# Patient Record
Sex: Male | Born: 1985 | Race: Black or African American | Hispanic: No | Marital: Single | Smoking: Current some day smoker
Health system: Southern US, Community
[De-identification: ages and names within clinical notes are randomized; demographics above are authoritative.]

## PROBLEM LIST (undated history)

## (undated) HISTORY — PX: SHOULDER SURGERY: SHX246

---
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
## 2005-03-08 ENCOUNTER — Emergency Department (HOSPITAL_COMMUNITY): Admission: EM | Admit: 2005-03-08 | Discharge: 2005-03-09 | Payer: Self-pay | Admitting: Emergency Medicine

## 2005-03-12 ENCOUNTER — Emergency Department (HOSPITAL_COMMUNITY): Admission: EM | Admit: 2005-03-12 | Discharge: 2005-03-12 | Payer: Self-pay | Admitting: Family Medicine

## 2006-11-25 ENCOUNTER — Emergency Department (HOSPITAL_COMMUNITY): Admission: EM | Admit: 2006-11-25 | Discharge: 2006-11-25 | Payer: Self-pay | Admitting: Emergency Medicine

## 2007-03-13 ENCOUNTER — Emergency Department (HOSPITAL_COMMUNITY): Admission: EM | Admit: 2007-03-13 | Discharge: 2007-03-13 | Payer: Self-pay | Admitting: Emergency Medicine

## 2007-04-12 ENCOUNTER — Ambulatory Visit (HOSPITAL_COMMUNITY): Admission: RE | Admit: 2007-04-12 | Discharge: 2007-04-12 | Payer: Self-pay | Admitting: Chiropractic Medicine

## 2008-02-26 ENCOUNTER — Emergency Department (HOSPITAL_COMMUNITY): Admission: EM | Admit: 2008-02-26 | Discharge: 2008-02-26 | Payer: Self-pay | Admitting: Emergency Medicine

## 2011-01-26 LAB — POCT I-STAT, CHEM 8
BUN: 12
Calcium, Ion: 1.17
Chloride: 102
Creatinine, Ser: 1.3
Glucose, Bld: 75
HCT: 46
Hemoglobin: 15.6
Potassium: 3.8
Sodium: 138
TCO2: 27

## 2011-01-26 LAB — URINALYSIS, ROUTINE W REFLEX MICROSCOPIC
Bilirubin Urine: NEGATIVE
Glucose, UA: NEGATIVE
Hgb urine dipstick: NEGATIVE
Ketones, ur: NEGATIVE
Nitrite: NEGATIVE
Protein, ur: NEGATIVE
Specific Gravity, Urine: 1.018
Urobilinogen, UA: 0.2
pH: 6

## 2012-06-13 ENCOUNTER — Encounter (HOSPITAL_COMMUNITY): Payer: Self-pay | Admitting: *Deleted

## 2012-06-13 ENCOUNTER — Emergency Department (INDEPENDENT_AMBULATORY_CARE_PROVIDER_SITE_OTHER)
Admission: EM | Admit: 2012-06-13 | Discharge: 2012-06-13 | Disposition: A | Discharge status: Home / Self Care | Payer: Self-pay | Source: Home / Self Care | Attending: Emergency Medicine | Admitting: Emergency Medicine

## 2012-06-13 DIAGNOSIS — S46019A Strain of muscle(s) and tendon(s) of the rotator cuff of unspecified shoulder, initial encounter: Secondary | ICD-10-CM

## 2012-06-13 DIAGNOSIS — S43429A Sprain of unspecified rotator cuff capsule, initial encounter: Secondary | ICD-10-CM

## 2012-06-13 DIAGNOSIS — S20219A Contusion of unspecified front wall of thorax, initial encounter: Secondary | ICD-10-CM

## 2012-06-13 MED ORDER — TRAMADOL HCL 50 MG PO TABS: 100.0000 mg | ORAL_TABLET | Freq: Three times a day (TID) | ORAL | Status: DC | PRN

## 2012-06-13 MED ORDER — METHOCARBAMOL 500 MG PO TABS: 500.0000 mg | ORAL_TABLET | Freq: Three times a day (TID) | ORAL | Status: DC

## 2012-06-13 MED ORDER — MELOXICAM 15 MG PO TABS: 15.0000 mg | ORAL_TABLET | Freq: Every day | ORAL | Status: DC
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
## 2013-07-14 ENCOUNTER — Emergency Department (INDEPENDENT_AMBULATORY_CARE_PROVIDER_SITE_OTHER)
Admission: EM | Admit: 2013-07-14 | Discharge: 2013-07-14 | Disposition: A | Discharge status: Home / Self Care | Payer: BC Managed Care – PPO | Source: Home / Self Care | Attending: Family Medicine | Admitting: Family Medicine

## 2013-07-14 ENCOUNTER — Encounter (HOSPITAL_COMMUNITY): Payer: Self-pay | Admitting: Emergency Medicine

## 2013-07-14 DIAGNOSIS — M545 Low back pain, unspecified: Secondary | ICD-10-CM

## 2013-07-14 MED ORDER — CYCLOBENZAPRINE HCL 10 MG PO TABS
10.0000 mg | ORAL_TABLET | Freq: Every evening | ORAL | Status: AC | PRN
Start: 2013-07-14 — End: ?

## 2013-07-14 MED ORDER — PREDNISONE 10 MG PO KIT: PACK | ORAL | Status: AC
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
## 2013-07-14 NOTE — Discharge Instructions (Signed)
Thank you for coming in today. Take prednisone as directed for 12 days. Use Flexeril at bedtime as needed. [facility] if not better Come back or go to the emergency room if you notice new weakness new numbness problems walking or bowel or bladder problems.  Back Exercises These exercises may help you when beginning to rehabilitate your injury. Your symptoms may resolve with or without further involvement from your physician, physical therapist or athletic trainer. While completing these exercises, remember:   Restoring tissue flexibility helps normal motion to return to the joints. This allows healthier, less painful movement and activity.  An effective stretch should be held for at least 30 seconds.  A stretch should never be painful. You should only feel a gentle lengthening or release in the stretched tissue. STRETCH  Extension, Prone on Elbows   Lie on your stomach on the floor, a bed will be too soft. Place your palms about shoulder width apart and at the height of your head.  Place your elbows under your shoulders. If this is too painful, stack pillows under your chest.  Allow your body to relax so that your hips drop lower and make contact more completely with the floor.  Hold this position for __________ seconds.  Slowly return to lying flat on the floor. Repeat __________ times. Complete this exercise __________ times per day.  RANGE OF MOTION  Extension, Prone Press Ups   Lie on your stomach on the floor, a bed will be too soft. Place your palms about shoulder width apart and at the height of your head.  Keeping your back as relaxed as possible, slowly straighten your elbows while keeping your hips on the floor. You may adjust the placement of your hands to maximize your comfort. As you gain motion, your hands will come more underneath your shoulders.  Hold this position __________ seconds.  Slowly return to lying flat on the  floor. Repeat __________ times. Complete this exercise __________ times per day.  RANGE OF MOTION- Quadruped, Neutral Spine   Assume a hands and knees position on a firm surface. Keep your hands under your shoulders and your knees under your hips. You may place padding under your knees for comfort.  Drop your head and point your tail bone toward the ground below you. This will round out your low back like an angry cat. Hold this position for __________ seconds.  Slowly lift your head and release your tail bone so that your back sags into a large arch, like an old horse.  Hold this position for __________ seconds.  Repeat this until you feel limber in your low back.  Now, find your "sweet spot." This will be the most comfortable position somewhere between the two previous positions. This is your neutral spine. Once you have found this position, tense your stomach muscles to support your low back.  Hold this position for __________ seconds. Repeat __________ times. Complete this exercise __________ times per day.  STRETCH  Flexion, Single Knee to Chest   Lie on a firm bed or floor with both legs extended in front of you.  Keeping one leg in contact with the floor, bring your opposite knee to your chest. Hold your leg in place by either grabbing behind your thigh or at your knee.  Pull until you feel a gentle stretch in your low back. Hold __________ seconds.  Slowly release your grasp and repeat the exercise with the opposite side. Repeat __________  times. Complete this exercise __________ times per day.  STRETCH - Hamstrings, Standing  Stand or sit and extend your right / left leg, placing your foot on a chair or foot stool  Keeping a slight arch in your low back and your hips straight forward.  Lead with your chest and lean forward at the waist until you feel a gentle stretch in the back of your right / left knee or thigh. (When done correctly, this exercise requires leaning only a  small distance.)  Hold this position for __________ seconds. Repeat __________ times. Complete this stretch __________ times per day. STRENGTHENING  Deep Abdominals, Pelvic Tilt   Lie on a firm bed or floor. Keeping your legs in front of you, bend your knees so they are both pointed toward the ceiling and your feet are flat on the floor.  Tense your lower abdominal muscles to press your low back into the floor. This motion will rotate your pelvis so that your tail bone is scooping upwards rather than pointing at your feet or into the floor.  With a gentle tension and even breathing, hold this position for __________ seconds. Repeat __________ times. Complete this exercise __________ times per day.  STRENGTHENING  Abdominals, Crunches   Lie on a firm bed or floor. Keeping your legs in front of you, bend your knees so they are both pointed toward the ceiling and your feet are flat on the floor. Cross your arms over your chest.  Slightly tip your chin down without bending your neck.  Tense your abdominals and slowly lift your trunk high enough to just clear your shoulder blades. Lifting higher can put excessive stress on the low back and does not further strengthen your abdominal muscles.  Control your return to the starting position. Repeat __________ times. Complete this exercise __________ times per day.  STRENGTHENING  Quadruped, Opposite UE/LE Lift   Assume a hands and knees position on a firm surface. Keep your hands under your shoulders and your knees under your hips. You may place padding under your knees for comfort.  Find your neutral spine and gently tense your abdominal muscles so that you can maintain this position. Your shoulders and hips should form a rectangle that is parallel with the floor and is not twisted.  Keeping your trunk steady, lift your right hand no higher than your shoulder and then your left leg no higher than your hip. Make sure you are not holding your  breath. Hold this position __________ seconds.  Continuing to keep your abdominal muscles tense and your back steady, slowly return to your starting position. Repeat with the opposite arm and leg. Repeat __________ times. Complete this exercise __________ times per day. Document Released: 04/30/2005 Document Revised: 07/05/2011 Document Reviewed: 07/25/2008 [facility] Patient Information 2014 [place], [place].
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
## 2013-10-08 ENCOUNTER — Emergency Department (INDEPENDENT_AMBULATORY_CARE_PROVIDER_SITE_OTHER)
Admission: EM | Admit: 2013-10-08 | Discharge: 2013-10-08 | Disposition: A | Discharge status: Home / Self Care | Payer: BC Managed Care – PPO | Source: Home / Self Care

## 2013-10-08 ENCOUNTER — Encounter (HOSPITAL_COMMUNITY): Payer: Self-pay | Admitting: Emergency Medicine

## 2013-10-08 ENCOUNTER — Emergency Department (INDEPENDENT_AMBULATORY_CARE_PROVIDER_SITE_OTHER): Payer: BC Managed Care – PPO

## 2013-10-08 DIAGNOSIS — S93401A Sprain of unspecified ligament of right ankle, initial encounter: Secondary | ICD-10-CM

## 2013-10-08 DIAGNOSIS — X500XXA Overexertion from strenuous movement or load, initial encounter: Secondary | ICD-10-CM

## 2013-10-08 DIAGNOSIS — Y939 Activity, unspecified: Secondary | ICD-10-CM

## 2013-10-08 DIAGNOSIS — S93409A Sprain of unspecified ligament of unspecified ankle, initial encounter: Secondary | ICD-10-CM

## 2013-10-08 MED ORDER — KETOROLAC TROMETHAMINE 10 MG PO TABS: ORAL_TABLET | ORAL | Status: AC

## 2013-10-08 NOTE — ED Provider Notes (Signed)
CSN: [id number]     Arrival date & time 10/08/13  1148 History   First MD Initiated Contact with Patient 10/08/13 1336     Chief Complaint  Patient presents with  . Ankle Pain   (Consider location/radiation/quality/duration/timing/severity/associated sxs/prior Treatment) HPI Comments: Keith Knox, this 28 year old male stepped in a hole with his right foot and twisted his ankle. He describes an inversion type injury. He has pain and swelling primarily to the lateral aspect of the ankle and proximal foot. There is pain with weightbearing.   History reviewed. No pertinent past medical history. Past Surgical History  Procedure Laterality Date  . Shoulder surgery  2009 and 2006    bilateral ligament repair   History reviewed. No pertinent family history. History  Substance Use Topics  . Smoking status: Current Some Day Smoker    Types: Cigarettes  . Smokeless tobacco: Not on file  . Alcohol Use: Yes     Comment: occasional    Review of Systems  Constitutional: Positive for activity change.  Respiratory: Negative.   Gastrointestinal: Negative.   Genitourinary: Negative.   Musculoskeletal: Positive for gait problem and joint swelling. Negative for back pain.       As per HPI  Skin: Negative.   Neurological: Negative for dizziness, weakness, numbness and headaches.    Allergies  Review of patient's allergies indicates no known allergies.  Home Medications   Prior to Admission medications   Medication Sig Start Date End Date Taking? Authorizing Provider  cyclobenzaprine (FLEXERIL) 10 MG tablet Take 1 tablet (10 mg total) by mouth at bedtime as needed for muscle spasms. 07/14/13   Gregor Hams, MD  PredniSONE 10 MG KIT 12 day dose pack po 07/14/13   Gregor Hams, MD   BP 119/77  Pulse 54  Temp(Src) 98.2 F (36.8 C) (Oral)  Resp 14  SpO2 100% Physical Exam  Nursing note and vitals reviewed. Constitutional: He is oriented to person, place, and time. He appears well-developed  and well-nourished.  HENT:  Head: Normocephalic and atraumatic.  Eyes: EOM are normal.  Neck: Normal range of motion. Neck supple.  Pulmonary/Chest: Effort normal. No respiratory distress.  Musculoskeletal:  Swelling and tenderness to the lateral aspect of the ankle, primarily surrounding the lateral malleolus. Tenderness extends to the dorsal aspect of the proximal foot. Mild ecchymosis. No deformity. No tenderness or swelling to the medial malleolus. Range of motion is limited particularly with dorsiflexion due to pain. Distal neurovascular motor sensory is intact with the exception of range of motion as above. Pedal pulses 2+.  Neurological: He is alert and oriented to person, place, and time. No cranial nerve deficit.  Skin: Skin is warm and dry.  Psychiatric: He has a normal mood and affect.    ED Course  Procedures (including critical care time) Labs Review Labs Reviewed - No data to display  Imaging Review Dg Ankle Complete Right  10/08/2013   CLINICAL DATA:  Ankle injury with pain can lateral soft tissue swelling.  EXAM: RIGHT ANKLE - COMPLETE 3+ VIEW  COMPARISON:  None.  FINDINGS: There is no evidence for fracture, subluxation or dislocation. Ankle mortise is preserved. No worrisome lytic or sclerotic osseous lesion.  IMPRESSION: No acute bony findings.   Electronically Signed   By: Misty Stanley M.D.   On: 10/08/2013 13:38     MDM   1. Right ankle sprain    RICE ASO Crutches No wt bearing for 2-3 d then as tolerated toradol po for pain  Janne Napoleon, NP 10/08/13 [place], NP 10/08/13 1426

## 2013-10-08 NOTE — Discharge Instructions (Signed)

## 2013-10-08 NOTE — ED Notes (Signed)
C/o right ankle pain States as he was walking in the park; he walked in to a hole that was covered by grass and his ankle rolled States ankle was swollen and the skin was discoloring Did ice and elevate ankle States hard to walk and put shoes on

## 2013-10-10 NOTE — ED Provider Notes (Signed)
Medical screening examination/treatment/procedure(s) were performed by non-physician practitioner and as supervising physician I was immediately available for consultation/collaboration.  Josip Merolla, M.D.  Omega Slager C Waldemar Siegel, MD 10/10/13 1329 

## 2014-02-18 ENCOUNTER — Encounter (HOSPITAL_COMMUNITY): Payer: Self-pay | Admitting: Emergency Medicine

## 2014-02-18 ENCOUNTER — Emergency Department (INDEPENDENT_AMBULATORY_CARE_PROVIDER_SITE_OTHER)
Admission: EM | Admit: 2014-02-18 | Discharge: 2014-02-18 | Disposition: A | Discharge status: Home / Self Care | Payer: BC Managed Care – PPO | Source: Home / Self Care | Attending: Emergency Medicine | Admitting: Emergency Medicine

## 2014-02-18 DIAGNOSIS — J069 Acute upper respiratory infection, unspecified: Secondary | ICD-10-CM

## 2014-02-18 MED ORDER — BENZONATATE 100 MG PO CAPS: 100.0000 mg | ORAL_CAPSULE | Freq: Three times a day (TID) | ORAL | Status: AC | PRN

## 2014-02-18 MED ORDER — IPRATROPIUM BROMIDE 0.06 % NA SOLN: 2.0000 | Freq: Four times a day (QID) | NASAL | Status: AC

## 2014-02-18 NOTE — ED Provider Notes (Signed)
CSN: [id number]     Arrival date & time 02/18/14  1448 History   First MD Initiated Contact with Patient 02/18/14 1009     Chief Complaint  Patient presents with  . URI   (Consider location/radiation/quality/duration/timing/severity/associated sxs/prior Treatment) HPI Comments: Smoker PCP: none Works at [employer] himself to be otherwise healthy.   Patient is a 28 y.o. male presenting with URI. The history is provided by the patient.  URI Presenting symptoms: congestion, cough, fatigue, fever, rhinorrhea and sore throat   Presenting symptoms: no ear pain and no facial pain   Severity:  Moderate Onset quality:  Gradual Duration:  3 days Timing:  Constant Progression:  Unchanged Chronicity:  New Associated symptoms: myalgias   Risk factors: no immunosuppression, no recent illness, no recent travel and no sick contacts     History reviewed. No pertinent past medical history. Past Surgical History  Procedure Laterality Date  . Shoulder surgery  2009 and 2006    bilateral ligament repair   History reviewed. No pertinent family history. History  Substance Use Topics  . Smoking status: Current Some Day Smoker    Types: Cigarettes  . Smokeless tobacco: Not on file  . Alcohol Use: Yes     Comment: occasional    Review of Systems  Constitutional: Positive for fever and fatigue.  HENT: Positive for congestion, rhinorrhea and sore throat. Negative for ear pain.   Respiratory: Positive for cough.   Musculoskeletal: Positive for myalgias.  All other systems reviewed and are negative.   Allergies  Review of patient's allergies indicates no known allergies.  Home Medications   Prior to Admission medications   Medication Sig Start Date End Date Taking? Authorizing Provider  benzonatate (TESSALON) 100 MG capsule Take 1 capsule (100 mg total) by mouth 3 (three) times daily as needed for cough. 02/18/14   Audelia Hives Dorathea Faerber, PA  cyclobenzaprine (FLEXERIL) 10 MG tablet  Take 1 tablet (10 mg total) by mouth at bedtime as needed for muscle spasms. 07/14/13   Gregor Hams, MD  ipratropium (ATROVENT) 0.06 % nasal spray Place 2 sprays into both nostrils 4 (four) times daily. 02/18/14   Audelia Hives Natsuko Kelsay, PA  ketorolac (TORADOL) 10 MG tablet Take 2 tabs first dose, then 1 q 6 hours prn pain. 10/08/13   Janne Napoleon, NP  PredniSONE 10 MG KIT 12 day dose pack po 07/14/13   Gregor Hams, MD   BP 127/73  Pulse 67  Temp(Src) 97.9 F (36.6 C) (Oral)  Resp 18  SpO2 100% Physical Exam  Nursing note and vitals reviewed. Constitutional: He is oriented to person, place, and time. He appears well-developed and well-nourished.  HENT:  Head: Normocephalic and atraumatic.  Right Ear: Hearing, tympanic membrane, external ear and ear canal normal.  Left Ear: Hearing, tympanic membrane, external ear and ear canal normal.  Nose: Nose normal.  Mouth/Throat: Uvula is midline, oropharynx is clear and moist and mucous membranes are normal.  Eyes: Conjunctivae are normal. Right eye exhibits no discharge. Left eye exhibits no discharge. No scleral icterus.  Neck: Normal range of motion. Neck supple.  Cardiovascular: Normal rate, regular rhythm and normal heart sounds.   Pulmonary/Chest: Effort normal and breath sounds normal. No stridor. No respiratory distress. He has no wheezes.  Lymphadenopathy:    He has no cervical adenopathy.  Neurological: He is alert and oriented to person, place, and time.  Skin: Skin is warm and dry. No rash noted.  Psychiatric: He has  a normal mood and affect. His behavior is normal.    ED Course  Procedures (including critical care time) Labs Review Labs Reviewed - No data to display  Imaging Review No results found.   MDM   1. URI (upper respiratory infection)    URI Symptomatic care at home Fluids, rest, tylenol as directed on packaging Atrovent nasal spray for nasal congestion Tessalon for cough Advised to expect resolution over  next 5-6 days   Lutricia Feil, PA 02/18/14 1037

## 2014-02-18 NOTE — ED Provider Notes (Signed)
Medical screening examination/treatment/procedure(s) were performed by resident physician or non-physician practitioner and as supervising physician I was immediately available for consultation/collaboration.  Randal BubaErin Kohlton Gilpatrick, MD     Charm RingsErin J Cindie Rajagopalan, MD 02/18/14 [phone]

## 2014-02-18 NOTE — Discharge Instructions (Signed)
Upper Respiratory Infection, Adult An upper respiratory infection (URI) is also sometimes known as the common cold. The upper respiratory tract includes the nose, sinuses, throat, trachea, and bronchi. Bronchi are the airways leading to the lungs. Most people improve within 1 week, but symptoms can last up to 2 weeks. A residual cough may last even longer.  CAUSES Many different viruses can infect the tissues lining the upper respiratory tract. The tissues become irritated and inflamed and often become very moist. Mucus production is also common. A cold is contagious. You can easily spread the virus to others by oral contact. This includes kissing, sharing a glass, coughing, or sneezing. Touching your mouth or nose and then touching a surface, which is then touched by another person, can also spread the virus. SYMPTOMS  Symptoms typically develop 1 to 3 days after you come in contact with a cold virus. Symptoms vary from person to person. They may include:  Runny nose.  Sneezing.  Nasal congestion.  Sinus irritation.  Sore throat.  Loss of voice (laryngitis).  Cough.  Fatigue.  Muscle aches.  Loss of appetite.  Headache.  Low-grade fever. DIAGNOSIS  You might diagnose your own cold based on familiar symptoms, since most people get a cold 2 to 3 times a year. Your caregiver can confirm this based on your exam. Most importantly, your caregiver can check that your symptoms are not due to another disease such as strep throat, sinusitis, pneumonia, asthma, or epiglottitis. Blood tests, throat tests, and X-rays are not necessary to diagnose a common cold, but they may sometimes be helpful in excluding other more serious diseases. Your caregiver will decide if any further tests are required. RISKS AND COMPLICATIONS  You may be at risk for a more severe case of the common cold if you smoke cigarettes, have chronic heart disease (such as heart failure) or lung disease (such as asthma), or if  you have a weakened immune system. The very young and very old are also at risk for more serious infections. Bacterial sinusitis, middle ear infections, and bacterial pneumonia can complicate the common cold. The common cold can worsen asthma and chronic obstructive pulmonary disease (COPD). Sometimes, these complications can require emergency medical care and may be life-threatening. PREVENTION  The best way to protect against getting a cold is to practice good hygiene. Avoid oral or hand contact with people with cold symptoms. Wash your hands often if contact occurs. There is no clear evidence that vitamin C, vitamin E, echinacea, or exercise reduces the chance of developing a cold. However, it is always recommended to get plenty of rest and practice good nutrition. TREATMENT  Treatment is directed at relieving symptoms. There is no cure. Antibiotics are not effective, because the infection is caused by a virus, not by bacteria. Treatment may include:  Increased fluid intake. Sports drinks offer valuable electrolytes, sugars, and fluids.  Breathing heated mist or steam (vaporizer or shower).  Eating chicken soup or other clear broths, and maintaining good nutrition.  Getting plenty of rest.  Using gargles or lozenges for comfort.  Controlling fevers with ibuprofen or acetaminophen as directed by your caregiver.  Increasing usage of your inhaler if you have asthma. Zinc gel and zinc lozenges, taken in the first 24 hours of the common cold, can shorten the duration and lessen the severity of symptoms. Pain medicines may help with fever, muscle aches, and throat pain. A variety of non-prescription medicines are available to treat congestion and runny nose. Your caregiver   can make recommendations and may suggest nasal or lung inhalers for other symptoms.  HOME CARE INSTRUCTIONS   Only take over-the-counter or prescription medicines for pain, discomfort, or fever as directed by your  caregiver.  Use a warm mist humidifier or inhale steam from a shower to increase air moisture. This may keep secretions moist and make it easier to breathe.  Drink enough water and fluids to keep your urine clear or pale yellow.  Rest as needed.  Return to work when your temperature has returned to normal or as your caregiver advises. You may need to stay home longer to avoid infecting others. You can also use a face mask and careful hand washing to prevent spread of the virus. SEEK MEDICAL CARE IF:   After the first few days, you feel you are getting worse rather than better.  You need your caregiver's advice about medicines to control symptoms.  You develop chills, worsening shortness of breath, or brown or red sputum. These may be signs of pneumonia.  You develop yellow or brown nasal discharge or pain in the face, especially when you bend forward. These may be signs of sinusitis.  You develop a fever, swollen neck glands, pain with swallowing, or white areas in the back of your throat. These may be signs of strep throat. SEEK IMMEDIATE MEDICAL CARE IF:   You have a fever.  You develop severe or persistent headache, ear pain, sinus pain, or chest pain.  You develop wheezing, a prolonged cough, cough up blood, or have a change in your usual mucus (if you have chronic lung disease).  You develop sore muscles or a stiff neck. Document Released: 10/06/2000 Document Revised: 07/05/2011 Document Reviewed: 07/18/2013 ExitCare Patient Information 2015 [employer]. This information is not intended to replace advice given to you by your health care provider. Make sure you discuss any questions you have with your health care provider.  

## 2014-02-18 NOTE — ED Notes (Signed)
C/o has not felt well since 10-23; not much relief w OTC medication

## 2014-09-18 ENCOUNTER — Emergency Department (INDEPENDENT_AMBULATORY_CARE_PROVIDER_SITE_OTHER)
Admission: EM | Admit: 2014-09-18 | Discharge: 2014-09-18 | Disposition: A | Discharge status: Home / Self Care | Payer: BLUE CROSS/BLUE SHIELD | Source: Home / Self Care | Attending: Family Medicine | Admitting: Family Medicine

## 2014-09-18 ENCOUNTER — Encounter (HOSPITAL_COMMUNITY): Payer: Self-pay | Admitting: Emergency Medicine

## 2014-09-18 ENCOUNTER — Emergency Department (INDEPENDENT_AMBULATORY_CARE_PROVIDER_SITE_OTHER): Payer: BLUE CROSS/BLUE SHIELD

## 2014-09-18 DIAGNOSIS — S93601A Unspecified sprain of right foot, initial encounter: Secondary | ICD-10-CM

## 2014-09-18 MED ORDER — IBUPROFEN 800 MG PO TABS: 800.0000 mg | ORAL_TABLET | Freq: Three times a day (TID) | ORAL | Status: AC

## 2014-09-18 NOTE — ED Notes (Signed)
Pt kicked the ground while playing soccer last night.  He rolled on his right great toe.  The toe hurt last night, but now the pain is in the top and bottom of the mid part of his foot.  He does have full ROM in the great toe.
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
## 2014-09-18 NOTE — Discharge Instructions (Signed)
Foot Sprain The muscles and cord like structures which attach muscle to bone (tendons) that surround the feet are made up of units. A foot sprain can occur at the weakest spot in any of these units. This condition is most often caused by injury to or overuse of the foot, as from playing contact sports, or aggravating a previous injury, or from poor conditioning, or obesity. SYMPTOMS  Pain with movement of the foot.  Tenderness and swelling at the injury site.  Loss of strength is present in moderate or severe sprains. THE THREE GRADES OR SEVERITY OF FOOT SPRAIN ARE:  Mild (Grade I): Slightly pulled muscle without tearing of muscle or tendon fibers or loss of strength.  Moderate (Grade II): Tearing of fibers in a muscle, tendon, or at the attachment to bone, with small decrease in strength.  Severe (Grade III): Rupture of the muscle-tendon-bone attachment, with separation of fibers. Severe sprain requires surgical repair. Often repeating (chronic) sprains are caused by overuse. Sudden (acute) sprains are caused by direct injury or over-use. DIAGNOSIS  Diagnosis of this condition is usually by your own observation. If problems continue, a caregiver may be required for further evaluation and treatment. X-rays may be required to make sure there are not breaks in the bones (fractures) present. Continued problems may require physical therapy for treatment. PREVENTION  Use strength and conditioning exercises appropriate for your sport.  Warm up properly prior to working out.  Use athletic shoes that are made for the sport you are participating in.  Allow adequate time for healing. Early return to activities makes repeat injury more likely, and can lead to an unstable arthritic foot that can result in prolonged disability. Mild sprains generally heal in 3 to 10 days, with moderate and severe sprains taking 2 to 10 weeks. Your caregiver can help you determine the proper time required for  healing. HOME CARE INSTRUCTIONS   Apply ice to the injury for 15-20 minutes, 03-04 times per day. Put the ice in a plastic bag and place a towel between the bag of ice and your skin.  An elastic wrap (like an Ace bandage) may be used to keep swelling down.  Keep foot above the level of the heart, or at least raised on a footstool, when swelling and pain are present.  Try to avoid use other than gentle range of motion while the foot is painful. Do not resume use until instructed by your caregiver. Then begin use gradually, not increasing use to the point of pain. If pain does develop, decrease use and continue the above measures, gradually increasing activities that do not cause discomfort, until you gradually achieve normal use.  Use crutches if and as instructed, and for the length of time instructed.  Keep injured foot and ankle wrapped between treatments.  Massage foot and ankle for comfort and to keep swelling down. Massage from the toes up towards the knee.  Only take over-the-counter or prescription medicines for pain, discomfort, or fever as directed by your caregiver. SEEK IMMEDIATE MEDICAL CARE IF:   Your pain and swelling increase, or pain is not controlled with medications.  You have loss of feeling in your foot or your foot turns cold or blue.  You develop new, unexplained symptoms, or an increase of the symptoms that brought you to your caregiver. MAKE SURE YOU:   Understand these instructions.  Will watch your condition.  Will get help right away if you are not doing well or get worse. Document Released:  10/02/2001 Document Revised: 07/05/2011 Document Reviewed: 11/30/2007 °ExitCare® Patient Information ©2015 [employer]. This information is not intended to replace advice given to you by your health care provider. Make sure you discuss any questions you have with your health care provider. °Contusion °A contusion is a deep bruise. Contusions are the result of an injury  that caused bleeding under the skin. The contusion may turn blue, purple, or yellow. Minor injuries will give you a painless contusion, but more severe contusions may stay painful and swollen for a few weeks.  °CAUSES  °A contusion is usually caused by a blow, trauma, or direct force to an area of the body. °SYMPTOMS  °· Swelling and redness of the injured area. °· Bruising of the injured area. °· Tenderness and soreness of the injured area. °· Pain. °DIAGNOSIS  °The diagnosis can be made by taking a history and physical exam. An X-ray, CT scan, or MRI may be needed to determine if there were any associated injuries, such as fractures. °TREATMENT  °Specific treatment will depend on what area of the body was injured. In general, the best treatment for a contusion is resting, icing, elevating, and applying cold compresses to the injured area. Over-the-counter medicines may also be recommended for pain control. Ask your caregiver what the best treatment is for your contusion. °HOME CARE INSTRUCTIONS  °· Put ice on the injured area. °· Put ice in a plastic bag. °· Place a towel between your skin and the bag. °· Leave the ice on for 15-20 minutes, 3-4 times a day, or as directed by your health care provider. °· Only take over-the-counter or prescription medicines for pain, discomfort, or fever as directed by your caregiver. Your caregiver may recommend avoiding anti-inflammatory medicines (aspirin, ibuprofen, and naproxen) for 48 hours because these medicines may increase bruising. °· Rest the injured area. °· If possible, elevate the injured area to reduce swelling. °SEEK IMMEDIATE MEDICAL CARE IF:  °· You have increased bruising or swelling. °· You have pain that is getting worse. °· Your swelling or pain is not relieved with medicines. °MAKE SURE YOU:  °· Understand these instructions. °· Will watch your condition. °· Will get help right away if you are not doing well or get worse. °Document Released: 01/20/2005  Document Revised: 04/17/2013 Document Reviewed: 02/15/2011 °ExitCare® Patient Information ©2015 [employer]. This information is not intended to replace advice given to you by your health care provider. Make sure you discuss any questions you have with your health care provider. ° °

## 2016-11-28 IMAGING — DX DG FOOT COMPLETE 3+V*R*
3 series · 3 of 3 positions shown · non-contrast
Comparison: None.

CLINICAL DATA: Foot injury, soccer game playing yesterday, right
foot pain

EXAM:
RIGHT FOOT COMPLETE - 3+ VIEW

[foot ap]
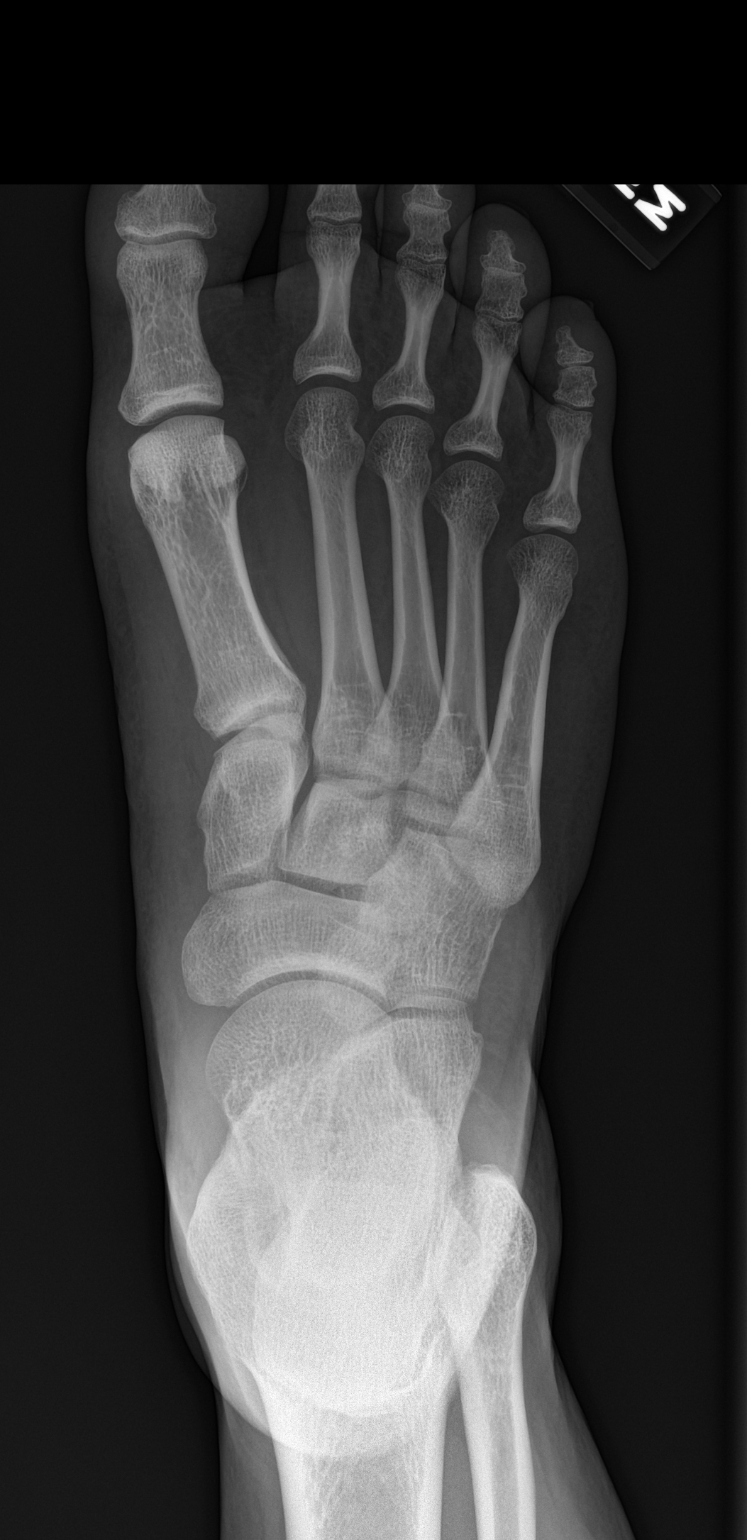

[foot obl]
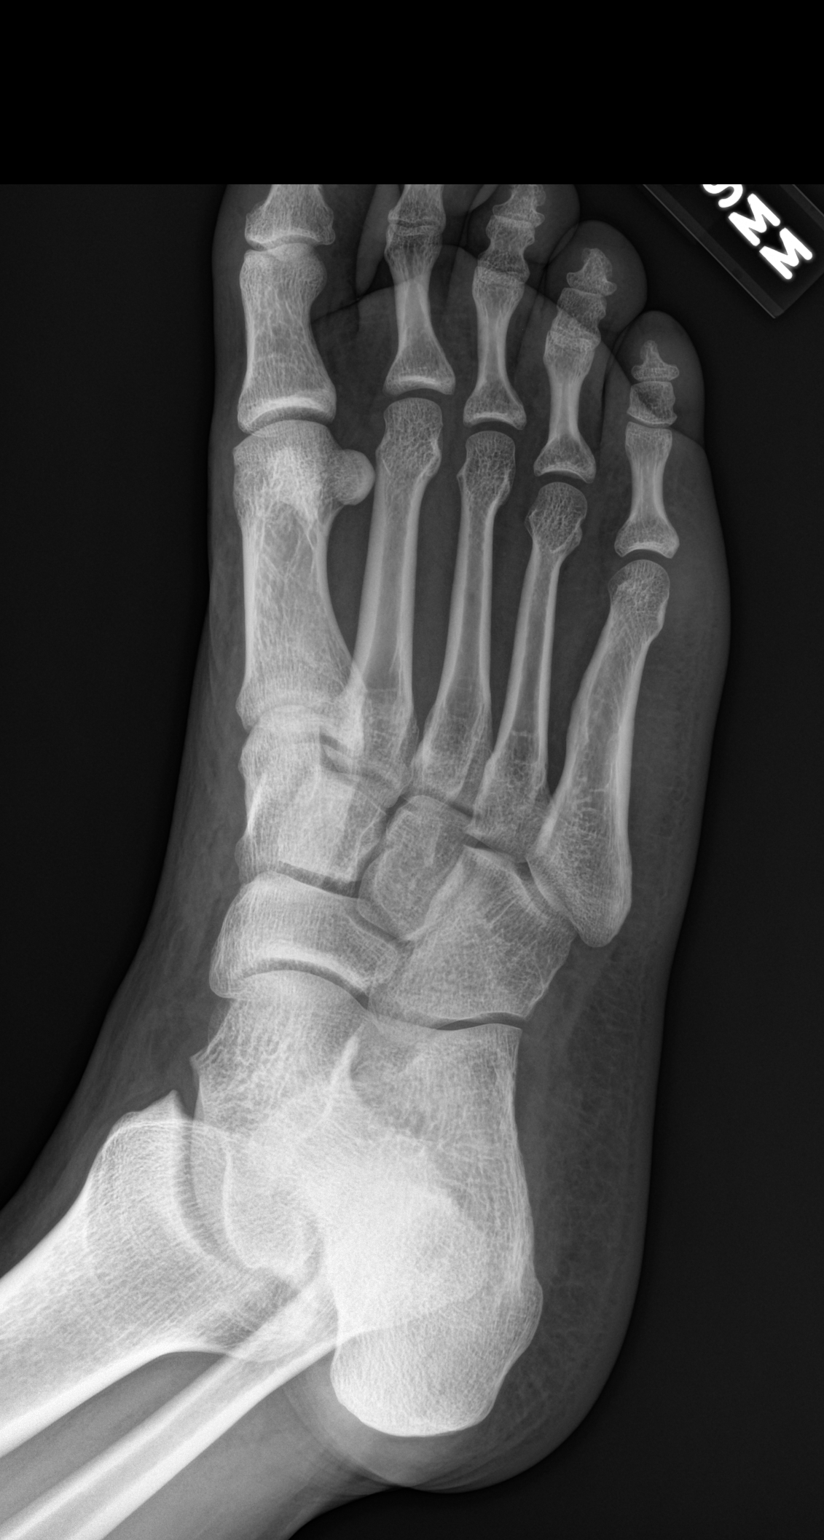

[foot lat]
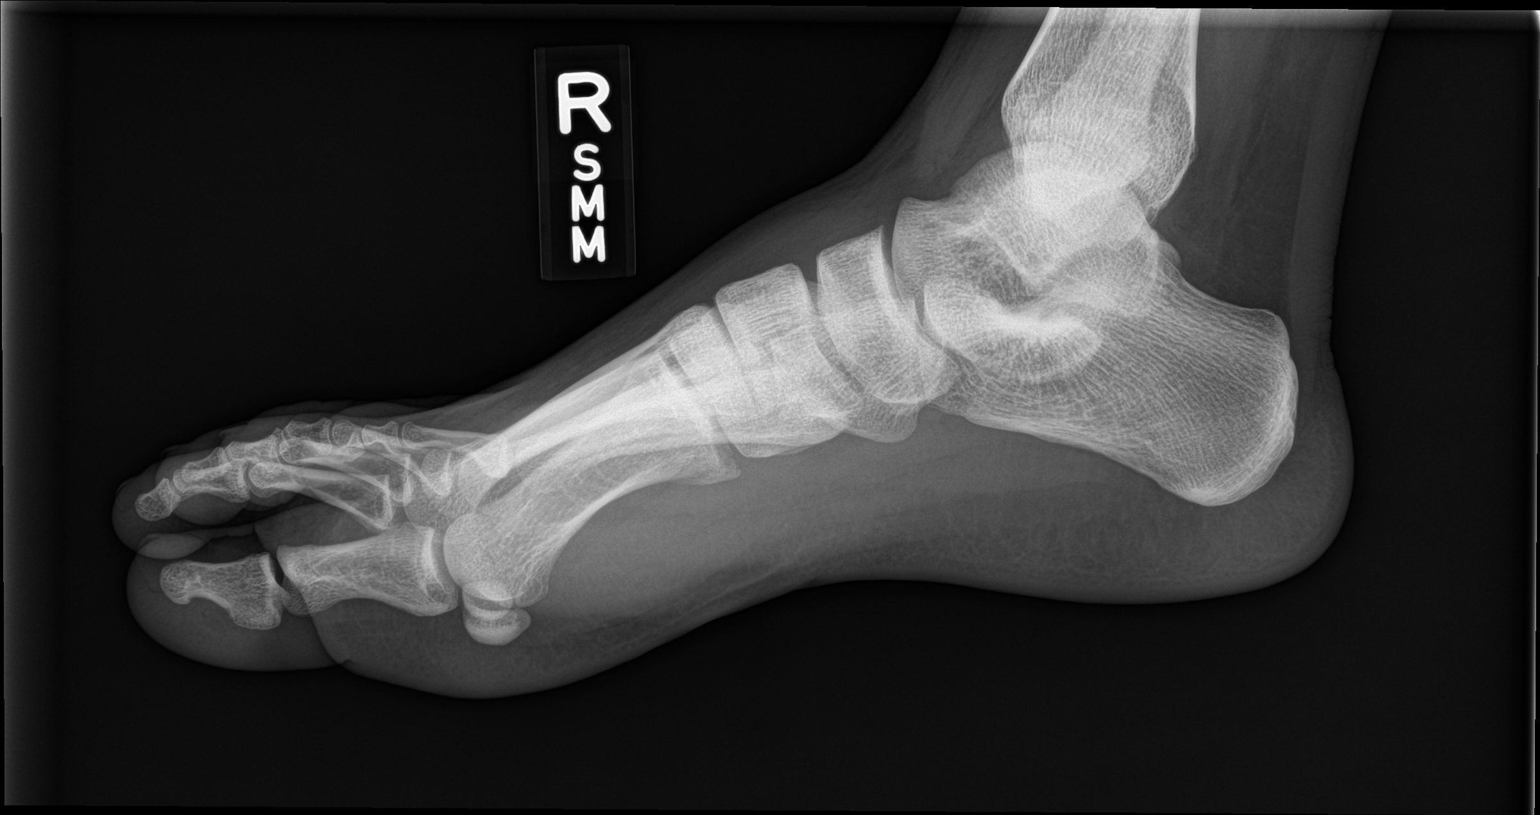

[3 of 3 positions shown; findings below may reference images not displayed]

FINDINGS: Three views of the right foot submitted. No acute fracture or
subluxation. No radiopaque foreign body.
IMPRESSION: Negative.
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
## 2022-09-27 ENCOUNTER — Telehealth: Payer: Self-pay

## 2022-09-27 ENCOUNTER — Ambulatory Visit: Payer: 59 | Attending: Nurse Practitioner

## 2022-09-27 DIAGNOSIS — M79642 Pain in left hand: Secondary | ICD-10-CM | POA: Insufficient documentation

## 2022-09-27 DIAGNOSIS — M542 Cervicalgia: Secondary | ICD-10-CM | POA: Diagnosis present

## 2022-09-29 NOTE — Telephone Encounter (Signed)
TC to office of [place] Scrape NP requesting OT consult to address L hand symptoms.  Spoke with CMA and given fax number of [facility] and request for an OT consult.
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
## 2022-10-05 ENCOUNTER — Ambulatory Visit: Payer: 59

## 2022-10-12 ENCOUNTER — Telehealth: Payer: Self-pay

## 2022-10-12 ENCOUNTER — Ambulatory Visit: Payer: 59

## 2022-10-12 NOTE — Telephone Encounter (Signed)
Called and left voicemail regarding missed visit. Also left reminder of attendance policy and off next visit.  Eloy End   10/12/22 3:03 PM
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
## 2022-10-18 ENCOUNTER — Ambulatory Visit: Payer: 59

## 2022-10-18 ENCOUNTER — Telehealth: Payer: Self-pay

## 2022-10-18 NOTE — Telephone Encounter (Signed)
TC due to missed visit.  Spoke directly to patient. Reminded of next appointment time  and date.  He related he had questions regarding regarding insurance coverage and was instructed to direct questions to front office staff.  Patient verbally understood.

## 2022-10-25 ENCOUNTER — Ambulatory Visit: Payer: 59 | Attending: Nurse Practitioner

## 2022-10-26 ENCOUNTER — Ambulatory Visit: Payer: 59

## 2022-10-26 ENCOUNTER — Telehealth: Payer: Self-pay

## 2022-10-26 NOTE — Telephone Encounter (Signed)
PT called patient but got no answer. PT will cancel all remaining visits but one on 7/2 due to attendance.

## 2022-11-01 ENCOUNTER — Ambulatory Visit: Payer: 59

## 2022-11-09 ENCOUNTER — Ambulatory Visit: Payer: 59
